# Patient Record
Sex: Male | Born: 2000 | Race: White | Hispanic: No | Marital: Single | Smoking: Never smoker
Health system: Southern US, Community
[De-identification: ages and names within clinical notes are randomized; demographics above are authoritative.]

## PROBLEM LIST (undated history)

## (undated) DIAGNOSIS — I350 Nonrheumatic aortic (valve) stenosis: Secondary | ICD-10-CM

## (undated) DIAGNOSIS — J45909 Unspecified asthma, uncomplicated: Secondary | ICD-10-CM

## (undated) HISTORY — PX: TONSILLECTOMY: SUR1361

---
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
## 2018-01-13 ENCOUNTER — Other Ambulatory Visit: Payer: Self-pay

## 2018-01-13 ENCOUNTER — Ambulatory Visit
Admission: EM | Admit: 2018-01-13 | Discharge: 2018-01-13 | Disposition: A | Discharge status: Home / Self Care | Payer: Medicaid Other | Attending: Emergency Medicine | Admitting: Emergency Medicine

## 2018-01-13 DIAGNOSIS — S29012A Strain of muscle and tendon of back wall of thorax, initial encounter: Secondary | ICD-10-CM | POA: Diagnosis not present

## 2018-01-13 DIAGNOSIS — X58XXXA Exposure to other specified factors, initial encounter: Secondary | ICD-10-CM

## 2018-01-13 MED ORDER — METHOCARBAMOL 750 MG PO TABS: 1500.0000 mg | ORAL_TABLET | Freq: Three times a day (TID) | ORAL | 0 refills | Status: AC

## 2018-01-13 MED ORDER — IBUPROFEN 600 MG PO TABS: 600.0000 mg | ORAL_TABLET | Freq: Four times a day (QID) | ORAL | 0 refills | Status: DC | PRN

## 2018-01-13 NOTE — ED Provider Notes (Signed)
HPI  SUBJECTIVE:  Shawn Dominguez is a 17 y.o. male who presents with 2 days of sharp, constant, mid thoracic back pain.  States that he may have slept on his back wrong, as he woke up with this pain.  He does report doing heavy lifting at work, but denies any more lifting than usual.  He states it radiated into his right ribs and right anterior chest this morning, it lasted seconds but has resolved.  There is been no further episodes of radiation.  He reports a mild, nonproductive cough, but no wheezing, fevers, shortness of breath, DOE.  No rash.  He has never had symptoms like this before.  He has tried Advil 600 mg, Biofreeze with improvement in symptoms.  He also tried a heating pad.  Symptoms are worse with torso rotation, bending forward, sitting up straight.  He took ibuprofen 2 hours prior to evaluation.  He has a past medical history of aortic stenosis, asthma.  No history of emphysema, COPD, smoking, vaping, pneumothorax, cancer, osteoporosis, shingles, chickenpox.  [id number]:[id number], Renae Fickle, MD    Past Medical History:  Diagnosis Date  . Aortic valvar stenosis   . Asthma     Past Surgical History:  Procedure Laterality Date  . TONSILLECTOMY      History reviewed. No pertinent family history.  Social History   Tobacco Use  . Smoking status: Never Smoker  . Smokeless tobacco: Never Used  Substance Use Topics  . Alcohol use: No    Alcohol/week: 0.0 standard drinks  . Drug use: Not Currently    No current facility-administered medications for this encounter.   Current Outpatient Medications:  .  ibuprofen (ADVIL,MOTRIN) 600 MG tablet, Take 1 tablet (600 mg total) by mouth every 6 (six) hours as needed., Disp: 30 tablet, Rfl: 0 .  methocarbamol (ROBAXIN) 750 MG tablet, Take 2 tablets (1,500 mg total) by mouth 3 (three) times daily for 5 days., Disp: 30 tablet, Rfl: 0  No Known Allergies   ROS  As noted in HPI.   Physical Exam  BP 127/62 (BP Location: Right Arm)   Pulse 69    Temp 98.3 F (36.8 C) (Oral)   Resp 18   Ht 5\' 8"  (1.727 m)   Wt 72.6 kg   SpO2 100%   BMI 24.33 kg/m   Constitutional: Well developed, well nourished, no acute distress Eyes:  EOMI, conjunctiva normal bilaterally HENT: Normocephalic, atraumatic,mucus membranes moist Respiratory: Normal inspiratory effort lungs clear bilaterally, good air movement bilaterally. Cardiovascular: Normal rate regular rhythm.  Positive murmur GI: nondistended.  skin: No rash over back or torso, skin intact Musculoskeletal: Positive muscular tenderness along the left parathoracic area.  No bony tenderness over the C, T, L-spine.  No paralumbar tenderness.  No trapezial, rhomboid tenderness. Neurologic: Alert & oriented x 3, no focal neuro deficits Psychiatric: Speech and behavior appropriate   ED Course   Medications - No data to display  No orders of the defined types were placed in this encounter.   No results found for this or any previous visit (from the past 24 hour(s)). No results found.  ED Clinical Impression  Strain of thoracic back region   ED Assessment/Plan  Presentation consistent with thoracic back strain.  Doubt pneumothorax, cardiac etiology, shingles at this point in time.  Imaging deferred today as he has no history of cancer, osteoporosis or trauma, or bony tenderness.  Will send home with ibuprofen 600 mg to take with 1 g of Tylenol together 3 or  4 times a day as needed for pain, Robaxin 1500 mg p.o. 3 times daily, and a work note for 2 days.   Discussed medical decision-making, and plan for follow-up with the patient.  Follow Up with PMD or here as needed if not getting better.  Discussed signs and symptoms that should prompt return to the emergency department.  Patient agrees with plan.   Meds ordered this encounter  Medications  . ibuprofen (ADVIL,MOTRIN) 600 MG tablet    Sig: Take 1 tablet (600 mg total) by mouth every 6 (six) hours as needed.    Dispense:  30 tablet     Refill:  0  . methocarbamol (ROBAXIN) 750 MG tablet    Sig: Take 2 tablets (1,500 mg total) by mouth 3 (three) times daily for 5 days.    Dispense:  30 tablet    Refill:  0    *This clinic note was created using Scientist, clinical (histocompatibility and immunogenetics). Therefore, there may be occasional mistakes despite careful proofreading.  ?     Domenick Gong, MD 01/13/18 1000

## 2018-01-13 NOTE — ED Triage Notes (Signed)
Patient complains of mid back pain x 2 days. Patient states that he has been using tylenol, advil and biofreeze without relief. States that he thinks he might have slept wrong.

## 2018-01-13 NOTE — Discharge Instructions (Addendum)
ibuprofen 600 mg to take with 1 g of Tylenol together 3 or 4 times a day as needed for pain, Robaxin 1500 mg p by mouth 3 times daily.  Try some gentle stretching and deep tissue massage.

## 2019-04-18 ENCOUNTER — Encounter: Payer: Self-pay | Admitting: Emergency Medicine

## 2019-04-18 ENCOUNTER — Other Ambulatory Visit: Payer: Self-pay

## 2019-04-18 ENCOUNTER — Ambulatory Visit
Admission: EM | Admit: 2019-04-18 | Discharge: 2019-04-18 | Disposition: A | Discharge status: Home / Self Care | Payer: BC Managed Care – PPO | Attending: Family Medicine | Admitting: Family Medicine

## 2019-04-18 DIAGNOSIS — M791 Myalgia, unspecified site: Secondary | ICD-10-CM

## 2019-04-18 DIAGNOSIS — Z20822 Contact with and (suspected) exposure to covid-19: Secondary | ICD-10-CM | POA: Insufficient documentation

## 2019-04-18 DIAGNOSIS — R05 Cough: Secondary | ICD-10-CM | POA: Diagnosis not present

## 2019-04-18 DIAGNOSIS — R0981 Nasal congestion: Secondary | ICD-10-CM | POA: Diagnosis not present

## 2019-04-18 DIAGNOSIS — R059 Cough, unspecified: Secondary | ICD-10-CM

## 2019-04-18 DIAGNOSIS — Z7189 Other specified counseling: Secondary | ICD-10-CM

## 2019-04-18 NOTE — ED Provider Notes (Addendum)
[facility] ____________________________________________  Time seen: Approximately 7:32 PM  I have reviewed the triage vital signs and the nursing notes.   HISTORY  Chief Complaint COVID testing  HPI Shawn Dominguez is a 19 y.o. male presenting for COVID-19 testing.  Patient reports 4 days ago he had cough, congestion and some body aches that lasted for 2 days.  Patient reports that he is now feeling back to normal and feels well.  Denies known fevers.  Denies sore throat.  Denies current cough or congestion. Denies chest pain, shortness of breath, vomiting, diarrhea, changes in taste or smell.  States feels well.  Denies known sick contacts.  Denies aggravating or alleviating factors.  Reports otherwise doing well.   Past Medical History:  Diagnosis Date  . Aortic valvar stenosis   . Asthma     There are no problems to display for this patient.   Past Surgical History:  Procedure Laterality Date  . TONSILLECTOMY       No current facility-administered medications for this encounter.  Current Outpatient Medications:  .  ibuprofen (ADVIL,MOTRIN) 600 MG tablet, Take 1 tablet (600 mg total) by mouth every 6 (six) hours as needed., Disp: 30 tablet, Rfl: 0  Allergies Patient has no known allergies.  History reviewed. No pertinent family history.  Social History Social History   Tobacco Use  . Smoking status: Never Smoker  . Smokeless tobacco: Never Used  Substance Use Topics  . Alcohol use: No    Alcohol/week: 0.0 standard drinks  . Drug use: Not Currently    Review of Systems Constitutional: No fever ENT: No sore throat. Cardiovascular: Denies chest pain. Respiratory: Denies shortness of breath. Gastrointestinal: No abdominal pain.  No nausea, no vomiting.  No diarrhea.  Musculoskeletal: Negative for back pain. Skin: Negative for rash.  ____________________________________________   PHYSICAL EXAM:  VITAL SIGNS: ED Triage Vitals  Enc Vitals  Group     BP 04/18/19 1915 118/67     Pulse Rate 04/18/19 1915 80     Resp 04/18/19 1915 18     Temp 04/18/19 1915 98.7 F (37.1 C)     Temp Source 04/18/19 1915 Oral     SpO2 04/18/19 1915 100 %     Weight 04/18/19 1913 200 lb (90.7 kg)     Height 04/18/19 1913 5\' 8"  (1.727 m)     Head Circumference --      Peak Flow --      Pain Score 04/18/19 1913 0     Pain Loc --      Pain Edu? --      Excl. in [place]? --     Constitutional: Alert and oriented. Well appearing and in no acute distress. Eyes: Conjunctivae are normal.  ENT      Head: Normocephalic and atraumatic. Cardiovascular: Normal heart rate. Good peripheral circulation. Respiratory: Normal respiratory effort without tachypnea nor retractions. Breath sounds are clear and equal bilaterally. No wheezes, rales, rhonchi. Musculoskeletal:  Steady gait.  Neurologic:  Normal speech and language. Speech is normal. No gait instability.  Skin:  Skin is warm, dry and intact. No rash noted. Psychiatric: Mood and affect are normal. Speech and behavior are normal. Patient exhibits appropriate insight and judgment   ___________________________________________   LABS (all labs ordered are listed, but only abnormal results are displayed)  Labs Reviewed  NOVEL CORONAVIRUS, NAA (HOSP ORDER, SEND-OUT TO REF LAB; TAT 18-24 HRS)   ____________________________________________   PROCEDURES Procedures    INITIAL IMPRESSION /  ASSESSMENT AND PLAN / ED COURSE  Pertinent labs & imaging results that were available during my care of the patient were reviewed by me and considered in my medical decision making (see chart for details).  Well-appearing patient.  No acute distress.  Suspect recent viral illness.  COVID-19 testing completed and advice given.  Continue to monitor and supportive care.  Discussed follow up and return parameters including no resolution or any worsening concerns. Patient verbalized understanding and agreed to plan.    ____________________________________________   FINAL CLINICAL IMPRESSION(S) / ED DIAGNOSES  Final diagnoses:  Cough  Advice given about COVID-19 virus infection     ED Discharge Orders    None       Note: This dictation was prepared with Dragon dictation along with smaller phrase technology. Any transcriptional errors that result from this process are unintentional.           Renford Dills, NP 04/18/19 1940

## 2019-04-18 NOTE — ED Triage Notes (Signed)
Patient here for COVID testing. Denies exposure. States he had symptoms 4 days agp of a fever and cough but those have resolved.

## 2019-04-20 LAB — NOVEL CORONAVIRUS, NAA (HOSP ORDER, SEND-OUT TO REF LAB; TAT 18-24 HRS): SARS-CoV-2, NAA: NOT DETECTED

## 2019-08-22 ENCOUNTER — Ambulatory Visit: Payer: BC Managed Care – PPO | Attending: Internal Medicine

## 2019-08-22 DIAGNOSIS — Z23 Encounter for immunization: Secondary | ICD-10-CM

## 2019-08-22 NOTE — Progress Notes (Signed)
   Covid-19 Vaccination Clinic  Name:  Shawn Dominguez    MRN: [id number] DOB: June 10, 2000  08/22/2019  [name] was observed post Covid-19 immunization for 15 minutes without incident. He was provided with Vaccine Information Sheet and instruction to access the V-Safe system.   [name] was instructed to call 911 with any severe reactions post vaccine: Marland Kitchen Difficulty breathing  . Swelling of face and throat  . A fast heartbeat  . A bad rash all over body  . Dizziness and weakness   Immunizations Administered    Name Date Dose VIS Date Route   Pfizer COVID-19 Vaccine 08/22/2019  6:00 PM 0.3 mL 05/23/2018 Intramuscular   Manufacturer: [employer], [employer]   Lot: [id number]   NDC: [address]-8

## 2019-09-12 ENCOUNTER — Ambulatory Visit: Payer: BC Managed Care – PPO | Attending: Internal Medicine

## 2019-09-12 DIAGNOSIS — Z23 Encounter for immunization: Secondary | ICD-10-CM

## 2019-09-12 NOTE — Progress Notes (Signed)
° °  Covid-19 Vaccination Clinic  Name:  Shawn Dominguez    MRN: [id number] DOB: 2000-10-07  09/12/2019  [name] was observed post Covid-19 immunization for 15 minutes without incident. He was provided with Vaccine Information Sheet and instruction to access the V-Safe system.   [name] was instructed to call 911 with any severe reactions post vaccine:  Difficulty breathing   Swelling of face and throat   A fast heartbeat   A bad rash all over body   Dizziness and weakness   Immunizations Administered    Name Date Dose VIS Date Route   Pfizer COVID-19 Vaccine 09/12/2019  4:15 PM 0.3 mL 05/23/2018 Intramuscular   Manufacturer: [employer], [employer]   Lot: [id number]   NDC: [address]-3

## 2020-04-02 ENCOUNTER — Encounter: Payer: Self-pay | Admitting: Emergency Medicine

## 2020-04-02 ENCOUNTER — Ambulatory Visit
Admission: EM | Admit: 2020-04-02 | Discharge: 2020-04-02 | Disposition: A | Discharge status: Home / Self Care | Payer: BC Managed Care – PPO | Attending: Emergency Medicine | Admitting: Emergency Medicine

## 2020-04-02 ENCOUNTER — Other Ambulatory Visit: Payer: Self-pay

## 2020-04-02 DIAGNOSIS — L02412 Cutaneous abscess of left axilla: Secondary | ICD-10-CM

## 2020-04-02 MED ORDER — SULFAMETHOXAZOLE-TRIMETHOPRIM 800-160 MG PO TABS: 1.0000 | ORAL_TABLET | Freq: Two times a day (BID) | ORAL | 0 refills | Status: AC

## 2020-04-02 MED ORDER — HYDROCODONE-ACETAMINOPHEN 5-325 MG PO TABS: 2.0000 | ORAL_TABLET | ORAL | 0 refills | Status: DC | PRN

## 2020-04-02 NOTE — ED Provider Notes (Signed)
[facility]    CSN: [id number] Arrival date & time: 04/02/20  1211      History   Chief Complaint Chief Complaint  Patient presents with  . Abscess    Left axilla    HPI Shawn Dominguez is a 20 y.o. male.   HPI   20 year old male here for evaluation of an abscess in his left armpit.  Patient reports that he noticed it 2 to 3 days ago.  He states that it is draining and he can get some yellow pus out when he squeezes it.  The area is very tender, red, and swollen.  Patient has had an abscess once before on his tailbone.  Patient denies any history of MRSA.  Patient denies fever.  Past Medical History:  Diagnosis Date  . Aortic valvar stenosis   . Asthma     There are no problems to display for this patient.   Past Surgical History:  Procedure Laterality Date  . TONSILLECTOMY         Home Medications    Prior to Admission medications   Medication Sig Start Date End Date Taking? Authorizing Provider  HYDROcodone-acetaminophen (NORCO/VICODIN) 5-325 MG tablet Take 2 tablets by mouth every 4 (four) hours as needed. 04/02/20  Yes Becky Augusta, NP  sulfamethoxazole-trimethoprim (BACTRIM DS) 800-160 MG tablet Take 1 tablet by mouth 2 (two) times daily for 10 days. 04/02/20 04/12/20 Yes Becky Augusta, NP  ibuprofen (ADVIL,MOTRIN) 600 MG tablet Take 1 tablet (600 mg total) by mouth every 6 (six) hours as needed. 01/13/18   Domenick Gong, MD    Family History Family History  Problem Relation Age of Onset  . Healthy Mother   . Healthy Father     Social History Social History   Tobacco Use  . Smoking status: Never Smoker  . Smokeless tobacco: Never Used  Vaping Use  . Vaping Use: Never used  Substance Use Topics  . Alcohol use: No    Alcohol/week: 0.0 standard drinks  . Drug use: Not Currently     Allergies   Patient has no known allergies.   Review of Systems Review of Systems  Constitutional: Negative for activity change, appetite change and  fever.  Skin: Positive for color change.  Hematological: Negative.   Psychiatric/Behavioral: Negative.      Physical Exam Triage Vital Signs ED Triage Vitals  Enc Vitals Group     BP 04/02/20 1416 123/67     Pulse Rate 04/02/20 1416 60     Resp 04/02/20 1416 18     Temp 04/02/20 1416 98.2 F (36.8 C)     Temp Source 04/02/20 1416 Oral     SpO2 04/02/20 1416 99 %     Weight 04/02/20 1327 200 lb (90.7 kg)     Height 04/02/20 1327 5\' 8"  (1.727 m)     Head Circumference --      Peak Flow --      Pain Score 04/02/20 1327 8     Pain Loc --      Pain Edu? --      Excl. in [place]? --    No data found.  Updated Vital Signs BP 123/67 (BP Location: Left Arm)   Pulse 60   Temp 98.2 F (36.8 C) (Oral)   Resp 18   Ht 5\' 8"  (1.727 m)   Wt 200 lb (90.7 kg)   SpO2 99%   BMI 30.41 kg/m   Visual Acuity Right Eye Distance:   Left  Eye Distance:   Bilateral Distance:    Right Eye Near:   Left Eye Near:    Bilateral Near:     Physical Exam Vitals and nursing note reviewed.  Constitutional:      General: He is not in acute distress.    Appearance: Normal appearance. He is not toxic-appearing.  HENT:     Head: Normocephalic and atraumatic.  Musculoskeletal:        General: Swelling and tenderness present.  Skin:    General: Skin is warm and dry.     Capillary Refill: Capillary refill takes less than 2 seconds.     Findings: Erythema present.  Neurological:     General: No focal deficit present.     Mental Status: He is alert and oriented to person, place, and time.  Psychiatric:        Mood and Affect: Mood normal.        Behavior: Behavior normal.        Thought Content: Thought content normal.        Judgment: Judgment normal.      UC Treatments / Results  Labs (all labs ordered are listed, but only abnormal results are displayed) Labs Reviewed - No data to display  EKG   Radiology No results found.  Procedures Procedures (including critical care  time)  Medications Ordered in UC Medications - No data to display  Initial Impression / Assessment and Plan / UC Course  I have reviewed the triage vital signs and the nursing notes.  Pertinent labs & imaging results that were available during my care of the patient were reviewed by me and considered in my medical decision making (see chart for details).   Duration of an abscess in his left axilla that is been there for the past 2 days.  The area where the axilla and the chest wall meet there is an area of redness and induration with mild swelling.  While palpating the area a small amount of yellow pus was expressed from a tract.  There is no loculation or fluctuance indicating a fluid collection.  Patient Isabella Stalling that he squeezed it yesterday and got some fluid out.  Patient presentation is consistent with cellulitis with abscess formation.  Given that it is draining on his own will encourage patient to continually apply warm compresses to try to facilitate the remainder of the infection to drain.  Will put patient on Bactrim twice daily for 10 days and give patient Norco for pain.   Final Clinical Impressions(s) / UC Diagnoses   Final diagnoses:  Abscess of axilla, left     Discharge Instructions     Apply warm compresses continually to the area in your left armpit where the abscess is to help fenestrating the infection.  Take the Bactrim twice daily for 10 days.  Take with a full glass of water.  Use over-the-counter ibuprofen as needed for mild to moderate pain.  Use the Norco, 1 tablet every 6 hours as needed for severe pain.  If your symptoms do not improve return for reevaluation.    ED Prescriptions    Medication Sig Dispense Auth. Provider   sulfamethoxazole-trimethoprim (BACTRIM DS) 800-160 MG tablet Take 1 tablet by mouth 2 (two) times daily for 10 days. 20 tablet Becky Augusta, NP   HYDROcodone-acetaminophen (NORCO/VICODIN) 5-325 MG tablet Take 2 tablets by mouth every 4  (four) hours as needed. 6 tablet Becky Augusta, NP     I have reviewed the PDMP during  this encounter.   Margarette Canada, NP 04/02/20 1510

## 2020-04-02 NOTE — Discharge Instructions (Addendum)
Apply warm compresses continually to the area in your left armpit where the abscess is to help fenestrating the infection.  Take the Bactrim twice daily for 10 days.  Take with a full glass of water.  Use over-the-counter ibuprofen as needed for mild to moderate pain.  Use the Norco, 1 tablet every 6 hours as needed for severe pain.  If your symptoms do not improve return for reevaluation.

## 2020-04-02 NOTE — ED Triage Notes (Signed)
Pt c/o abscess in left axilla. Started 2 days ago. He states it is draining.

## 2022-03-05 ENCOUNTER — Ambulatory Visit
Admission: EM | Admit: 2022-03-05 | Discharge: 2022-03-05 | Disposition: A | Discharge status: Home / Self Care | Payer: BC Managed Care – PPO | Attending: Physician Assistant | Admitting: Physician Assistant

## 2022-03-05 DIAGNOSIS — Z1152 Encounter for screening for COVID-19: Secondary | ICD-10-CM | POA: Insufficient documentation

## 2022-03-05 DIAGNOSIS — J45901 Unspecified asthma with (acute) exacerbation: Secondary | ICD-10-CM | POA: Insufficient documentation

## 2022-03-05 DIAGNOSIS — R0602 Shortness of breath: Secondary | ICD-10-CM

## 2022-03-05 DIAGNOSIS — Z7951 Long term (current) use of inhaled steroids: Secondary | ICD-10-CM | POA: Insufficient documentation

## 2022-03-05 DIAGNOSIS — R051 Acute cough: Secondary | ICD-10-CM | POA: Insufficient documentation

## 2022-03-05 LAB — RESP PANEL BY RT-PCR (FLU A&B, COVID) ARPGX2
Influenza A by PCR: NEGATIVE
Influenza B by PCR: NEGATIVE
SARS Coronavirus 2 by RT PCR: NEGATIVE

## 2022-03-05 MED ORDER — DEXAMETHASONE SODIUM PHOSPHATE 10 MG/ML IJ SOLN
10.0000 mg | Freq: Once | INTRAMUSCULAR | Status: AC
  Administered 2022-03-05: 10 mg via INTRAMUSCULAR

## 2022-03-05 MED ORDER — PSEUDOEPH-BROMPHEN-DM 30-2-10 MG/5ML PO SYRP: 10.0000 mL | ORAL_SOLUTION | Freq: Four times a day (QID) | ORAL | 0 refills | Status: AC | PRN

## 2022-03-05 MED ORDER — PREDNISONE 10 MG PO TABS: ORAL_TABLET | ORAL | 0 refills | Status: DC

## 2022-03-05 NOTE — Discharge Instructions (Signed)
-  We are testing you for COVID and flu.  We will call you with any positive results and send antiviral medication if necessary.  If you do not hear from [place], testing is negative and you have a virus and flareup of your asthma.  I sent steroids to the pharmacy and you received a steroid injection today.  Start the oral corticosteroids tomorrow early evening or afternoon.  Plenty rest and fluids. - You declined a breathing treatment here.  If you are inhaler and the corticosteroid did not improve your breathing in the next day or 2 you need to be seen again right away in the emergency department.

## 2022-03-05 NOTE — ED Triage Notes (Signed)
Pt c/o SOB, nasal congestion, cough, sore throat, loss of taste x2days  Pt states that he has an inhaler  Pt took Zithromax this morning and states this was his first dose.

## 2022-03-05 NOTE — ED Provider Notes (Signed)
[facility]    CSN: [id number] Arrival date & time: 03/05/22  1813      History   Chief Complaint Chief Complaint  Patient presents with   Shortness of Breath   Cough    HPI Shawn Dominguez is a 21 y.o. male with history of asthma.  He presents today for onset of cough, congestion, sore throat, diminished sense of taste about 2 days ago.  He denies any fever.  He has had shortness of breath and wheezing.  He is used his albuterol inhaler without any improvement in his shortness of breath.  He reports that he is taking azithromycin as prescribed by his PCP and took first dose this morning.  He is not taking any cough medication.  He initially does not want to be tested for COVID and flu but agrees to be tested.  He has no other complaints.  HPI  Past Medical History:  Diagnosis Date   Aortic valvar stenosis    Asthma     There are no problems to display for this patient.   Past Surgical History:  Procedure Laterality Date   TONSILLECTOMY         Home Medications    Prior to Admission medications   Medication Sig Start Date End Date Taking? Authorizing Provider  albuterol (VENTOLIN HFA) 108 (90 Base) MCG/ACT inhaler Inhale into the lungs. 02/19/22 02/19/23 Yes [provider]  azithromycin (ZITHROMAX) 250 MG tablet Take 2 tablets (500 mg) on  Day 1,  followed by 1 tablet (250 mg) once daily on Days 2 through 5. 03/03/22 03/08/22 Yes [provider]  brompheniramine-pseudoephedrine-DM 30-2-10 MG/5ML syrup Take 10 mLs by mouth 4 (four) times daily as needed for up to 7 days. 03/05/22 03/12/22 Yes Shirlee Latch, PA-C  predniSONE (DELTASONE) 10 MG tablet Take 6 tabs on day 1 and decrease by 1 tablet daily until complete 03/05/22  Yes Shirlee Latch, PA-C    Family History Family History  Problem Relation Age of Onset   Healthy Mother    Healthy Father     Social History Social History   Tobacco Use   Smoking status: Never   Smokeless  tobacco: Never  Vaping Use   Vaping Use: Never used  Substance Use Topics   Alcohol use: No    Alcohol/week: 0.0 standard drinks of alcohol   Drug use: Not Currently     Allergies   Patient has no known allergies.   Review of Systems Review of Systems  Constitutional:  Positive for fatigue. Negative for fever.  HENT:  Positive for congestion, rhinorrhea and sore throat. Negative for sinus pressure and sinus pain.   Respiratory:  Positive for cough and shortness of breath.   Cardiovascular:  Negative for chest pain.  Gastrointestinal:  Negative for abdominal pain, diarrhea, nausea and vomiting.  Musculoskeletal:  Negative for myalgias.  Neurological:  Positive for headaches. Negative for weakness and light-headedness.  Hematological:  Negative for adenopathy.     Physical Exam Triage Vital Signs ED Triage Vitals  Enc Vitals Group     BP 03/05/22 1837 120/79     Pulse Rate 03/05/22 1837 (!) 114     Resp 03/05/22 1837 18     Temp 03/05/22 1837 98.5 F (36.9 C)     Temp Source 03/05/22 1837 Oral     SpO2 03/05/22 1837 93 %     Weight 03/05/22 1836 240 lb (108.9 kg)     Height 03/05/22 1836 5'  7" (1.702 m)     Head Circumference --      Peak Flow --      Pain Score 03/05/22 1835 7     Pain Loc --      Pain Edu? --      Excl. in GC? --    No data found.  Updated Vital Signs BP 120/79 (BP Location: Left Arm)   Pulse (!) 114   Temp 98.5 F (36.9 C) (Oral)   Resp 18   Ht 5\' 7"  (1.702 m)   Wt 240 lb (108.9 kg)   SpO2 93%   BMI 37.59 kg/m    Physical Exam Vitals and nursing note reviewed.  Constitutional:      General: He is not in acute distress.    Appearance: Normal appearance. He is well-developed.  HENT:     Head: Normocephalic and atraumatic.     Nose: Congestion present.     Mouth/Throat:     Mouth: Mucous membranes are moist.     Pharynx: Oropharynx is clear.  Eyes:     General: No scleral icterus.    Conjunctiva/sclera: Conjunctivae normal.   Cardiovascular:     Rate and Rhythm: Regular rhythm. Tachycardia present.     Heart sounds: Normal heart sounds.  Pulmonary:     Effort: Pulmonary effort is normal. No respiratory distress.     Breath sounds: Wheezing present. No rhonchi or rales.  Abdominal:     Palpations: Abdomen is soft.  Musculoskeletal:     Cervical back: Neck supple.  Skin:    General: Skin is warm and dry.     Capillary Refill: Capillary refill takes less than 2 seconds.  Neurological:     General: No focal deficit present.     Mental Status: He is alert. Mental status is at baseline.     Motor: No weakness.     Gait: Gait normal.  Psychiatric:        Mood and Affect: Mood normal.        Behavior: Behavior normal.      UC Treatments / Results  Labs (all labs ordered are listed, but only abnormal results are displayed) Labs Reviewed  RESP PANEL BY RT-PCR (FLU A&B, COVID) ARPGX2    EKG   Radiology No results found.  Procedures Procedures (including critical care time)  Medications Ordered in UC Medications  dexamethasone (DECADRON) injection 10 mg (10 mg Intramuscular Given 03/05/22 1853)    Initial Impression / Assessment and Plan / UC Course  I have reviewed the triage vital signs and the nursing notes.  Pertinent labs & imaging results that were available during my care of the patient were reviewed by me and considered in my medical decision making (see chart for details).    21 year old male with history of asthma presents for cough, congestion, shortness of breath, wheezing, loss of taste over the past 1-1/2 to 2 days.  Is taking azithromycin and using albuterol.  Initially declines COVID and flu testing but is agreeable to it after I speak with him.  He is not in any acute distress and speaking in full sentences.  His oxygen is 93 to percent to 94%.  He is tachycardic.  He does have scattered wheezes throughout.  Nasal congestion also present.  Respiratory panel obtained.  Awaiting  result.  He declines nebulizer treatment.  Will treat at this time with Bromfed-DM and prednisone.  He was given dexamethasone injection of 10 mg in the clinic and advised  to start the prednisone taper tomorrow.  All negative respiratory panel.  Suspect patient does have viral illness flaring up his asthma.  We thoroughly discussed emergency department guidelines especially if his breathing worsens.  He is agreeable.   Final Clinical Impressions(s) / UC Diagnoses   Final diagnoses:  Asthma with acute exacerbation, unspecified asthma severity, unspecified whether persistent  Acute cough  Shortness of breath     Discharge Instructions      -We are testing you for COVID and flu.  We will call you with any positive results and send antiviral medication if necessary.  If you do not hear from [place], testing is negative and you have a virus and flareup of your asthma.  I sent steroids to the pharmacy and you received a steroid injection today.  Start the oral corticosteroids tomorrow early evening or afternoon.  Plenty rest and fluids. - You declined a breathing treatment here.  If you are inhaler and the corticosteroid did not improve your breathing in the next day or 2 you need to be seen again right away in the emergency department.     ED Prescriptions     Medication Sig Dispense Auth. Provider   brompheniramine-pseudoephedrine-DM 30-2-10 MG/5ML syrup Take 10 mLs by mouth 4 (four) times daily as needed for up to 7 days. 150 mL Eusebio Friendly B, PA-C   predniSONE (DELTASONE) 10 MG tablet Take 6 tabs on day 1 and decrease by 1 tablet daily until complete 21 tablet Shirlee Latch, PA-C      PDMP not reviewed this encounter.   Shirlee Latch, PA-C 03/05/22 1936

## 2022-03-15 ENCOUNTER — Encounter: Payer: Self-pay | Admitting: Emergency Medicine

## 2022-03-15 ENCOUNTER — Ambulatory Visit
Admission: EM | Admit: 2022-03-15 | Discharge: 2022-03-15 | Disposition: A | Discharge status: Home / Self Care | Payer: Self-pay | Attending: Family Medicine | Admitting: Family Medicine

## 2022-03-15 DIAGNOSIS — H6121 Impacted cerumen, right ear: Secondary | ICD-10-CM

## 2022-03-15 NOTE — ED Triage Notes (Signed)
Pt c/o bilateral ear fullness. He states the right is worse than the left and is making it hard to ear. Started about a week ago.

## 2022-03-15 NOTE — Discharge Instructions (Signed)
Stop by the pharmacy to pick up Debrox for earwax removal. Use once a week for the next 2-3 weeks. Then monthly, to prevent blockage.

## 2022-03-15 NOTE — ED Provider Notes (Signed)
[facility]    CSN: [id number] Arrival date & time: 03/15/22  1134      History   Chief Complaint Chief Complaint  Patient presents with   Ear Fullness    bilateral    HPI Shawn Dominguez is a 21 y.o. male.   HPI   Shawn Dominguez presents for muffled hearing in the right ear.  He has been using the Debrox system without relief.  He had some pressure in his left ear however this has resolved.  Has no respiratory symptoms at this time.  Denies pain, tenderness, discharge.  He believes it is related to his earwax.  He does use ear buds and notices that when he takes them out he has earwax on them.  He is requesting a leave note as he was seen on 03/05/2022 and missed a week of work.      Past Medical History:  Diagnosis Date   Aortic valvar stenosis    Asthma     There are no problems to display for this patient.   Past Surgical History:  Procedure Laterality Date   TONSILLECTOMY         Home Medications    Prior to Admission medications   Medication Sig Start Date End Date Taking? Authorizing Provider  albuterol (VENTOLIN HFA) 108 (90 Base) MCG/ACT inhaler Inhale into the lungs. 02/19/22 02/19/23  [provider]    Family History Family History  Problem Relation Age of Onset   Healthy Mother    Healthy Father     Social History Social History   Tobacco Use   Smoking status: Never   Smokeless tobacco: Never  Vaping Use   Vaping Use: Never used  Substance Use Topics   Alcohol use: No    Alcohol/week: 0.0 standard drinks of alcohol   Drug use: Not Currently     Allergies   Patient has no known allergies.   Review of Systems Review of Systems: :negative unless otherwise stated in HPI.      Physical Exam Triage Vital Signs ED Triage Vitals  Enc Vitals Group     BP 03/15/22 1352 (!) 129/99     Pulse Rate 03/15/22 1352 (!) 108     Resp 03/15/22 1352 16     Temp 03/15/22 1352 98.9 F (37.2 C)     Temp Source 03/15/22  1352 Oral     SpO2 03/15/22 1352 100 %     Weight 03/15/22 1349 240 lb 1.3 oz (108.9 kg)     Height 03/15/22 1349 5\' 7"  (1.702 m)     Head Circumference --      Peak Flow --      Pain Score 03/15/22 1349 3     Pain Loc --      Pain Edu? --      Excl. in GC? --    No data found.  Updated Vital Signs BP (!) 129/99 (BP Location: Left Arm)   Pulse (!) 108   Temp 98.9 F (37.2 C) (Oral)   Resp 16   Ht 5\' 7"  (1.702 m)   Wt 108.9 kg   SpO2 100%   BMI 37.60 kg/m   Visual Acuity Right Eye Distance:   Left Eye Distance:   Bilateral Distance:    Right Eye Near:   Left Eye Near:    Bilateral Near:     Physical Exam GEN:     alert, non-toxic appearing male in no distress    HENT:  mucus  membranes moist, no nasal discharge, right TM cerumen impacted, left TM normal, normal external auditory canals bilaterally, nontender tragus EYES:   no scleral injection or discharge NECK:  normal ROM RESP:  no increased work of breathing,  CVS:   regular rate Skin:   warm and dry    UC Treatments / Results  Labs (all labs ordered are listed, but only abnormal results are displayed) Labs Reviewed - No data to display  EKG   Radiology No results found.  Procedures Procedures (including critical care time)  Medications Ordered in UC Medications - No data to display  Initial Impression / Assessment and Plan / UC Course  I have reviewed the triage vital signs and the nursing notes.  Pertinent labs & imaging results that were available during my care of the patient were reviewed by me and considered in my medical decision making (see chart for details).     Cerumen Impaction  Ceruminosis is noted on the right.  Wax is removed by syringing. Instructions for home care to prevent wax buildup are given.    Asthma Stable. Patient seen on 03/05/2022 for asthma exacerbation.  He states he missed a full week of work and is requesting relief note.  Explained to patient that he is unable  to get a backdated note.  Negative for COVID and flu at the time.   Discussed MDM, treatment plan and plan for follow-up with patient/parent who agrees with plan.   Final Clinical Impressions(s) / UC Diagnoses   Final diagnoses:  Hearing loss of right ear due to cerumen impaction     Discharge Instructions      Stop by the pharmacy to pick up Debrox for earwax removal. Use once a week for the next 2-3 weeks. Then monthly, to prevent blockage.         ED Prescriptions   None    PDMP not reviewed this encounter.   Katha Cabal, DO 03/15/22 1508

## 2022-03-25 ENCOUNTER — Ambulatory Visit: Admit: 2022-03-25 | Payer: Self-pay

## 2022-03-26 ENCOUNTER — Encounter: Payer: Self-pay | Admitting: Emergency Medicine

## 2022-03-26 ENCOUNTER — Ambulatory Visit
Admission: EM | Admit: 2022-03-26 | Discharge: 2022-03-26 | Disposition: A | Discharge status: Home / Self Care | Payer: Self-pay | Attending: Emergency Medicine | Admitting: Emergency Medicine

## 2022-03-26 DIAGNOSIS — H66003 Acute suppurative otitis media without spontaneous rupture of ear drum, bilateral: Secondary | ICD-10-CM

## 2022-03-26 MED ORDER — AMOXICILLIN-POT CLAVULANATE 875-125 MG PO TABS: 1.0000 | ORAL_TABLET | Freq: Two times a day (BID) | ORAL | 0 refills | Status: AC

## 2022-03-26 NOTE — ED Provider Notes (Signed)
[facility]    CSN: [id number] Arrival date & time: 03/26/22  1728      History   Chief Complaint Chief Complaint  Patient presents with   Ear Fullness    HPI Shawn Dominguez is a 21 y.o. male.   HPI  21 year old male here for evaluation of bilateral ear fullness.  Patient reports that he has had ear fullness for the last couple of days.  This is not associate with any fever, runny nose, nasal congestion, or drainage from his ears.  He does endorse some mild ringing on the left.  He is also had a mild sore throat and a mild nonproductive cough.  Past Medical History:  Diagnosis Date   Aortic valvar stenosis    Asthma     There are no problems to display for this patient.   Past Surgical History:  Procedure Laterality Date   TONSILLECTOMY         Home Medications    Prior to Admission medications   Medication Sig Start Date End Date Taking? Authorizing Provider  amoxicillin-clavulanate (AUGMENTIN) 875-125 MG tablet Take 1 tablet by mouth every 12 (twelve) hours for 10 days. 03/26/22 04/05/22 Yes Becky Augusta, NP  albuterol (VENTOLIN HFA) 108 (90 Base) MCG/ACT inhaler Inhale into the lungs. 02/19/22 02/19/23  [provider]    Family History Family History  Problem Relation Age of Onset   Healthy Mother    Healthy Father     Social History Social History   Tobacco Use   Smoking status: Never   Smokeless tobacco: Never  Vaping Use   Vaping Use: Never used  Substance Use Topics   Alcohol use: No    Alcohol/week: 0.0 standard drinks of alcohol   Drug use: Not Currently     Allergies   Patient has no known allergies.   Review of Systems Review of Systems  Constitutional:  Negative for fever.  HENT:  Positive for ear pain and sore throat. Negative for ear discharge.   Respiratory:  Positive for cough.      Physical Exam Triage Vital Signs ED Triage Vitals  Enc Vitals Group     BP 03/26/22 1809 111/72     Pulse Rate  03/26/22 1809 82     Resp 03/26/22 1809 15     Temp 03/26/22 1809 97.8 F (36.6 C)     Temp Source 03/26/22 1809 Oral     SpO2 03/26/22 1809 99 %     Weight 03/26/22 1807 240 lb (108.9 kg)     Height 03/26/22 1807 5\' 7"  (1.702 m)     Head Circumference --      Peak Flow --      Pain Score 03/26/22 1807 0     Pain Loc --      Pain Edu? --      Excl. in GC? --    No data found.  Updated Vital Signs BP 111/72 (BP Location: Right Arm)   Pulse 82   Temp 97.8 F (36.6 C) (Oral)   Resp 15   Ht 5\' 7"  (1.702 m)   Wt 240 lb (108.9 kg)   SpO2 99%   BMI 37.59 kg/m   Visual Acuity Right Eye Distance:   Left Eye Distance:   Bilateral Distance:    Right Eye Near:   Left Eye Near:    Bilateral Near:     Physical Exam Vitals and nursing note reviewed.  Constitutional:      Appearance:  Normal appearance. He is not ill-appearing.  HENT:     Head: Normocephalic and atraumatic.     Right Ear: Ear canal and external ear normal. There is no impacted cerumen.     Left Ear: Ear canal and external ear normal. There is no impacted cerumen.     Ears:     Comments: Bilateral tympanic membranes erythematous injected with a loss of landmarks.    Mouth/Throat:     Mouth: Mucous membranes are moist.     Pharynx: Oropharynx is clear. No oropharyngeal exudate or posterior oropharyngeal erythema.  Skin:    General: Skin is warm and dry.     Capillary Refill: Capillary refill takes less than 2 seconds.  Neurological:     General: No focal deficit present.     Mental Status: He is alert and oriented to person, place, and time.  Psychiatric:        Mood and Affect: Mood normal.        Behavior: Behavior normal.        Thought Content: Thought content normal.        Judgment: Judgment normal.      UC Treatments / Results  Labs (all labs ordered are listed, but only abnormal results are displayed) Labs Reviewed - No data to display  EKG   Radiology No results  found.  Procedures Procedures (including critical care time)  Medications Ordered in UC Medications - No data to display  Initial Impression / Assessment and Plan / UC Course  I have reviewed the triage vital signs and the nursing notes.  Pertinent labs & imaging results that were available during my care of the patient were reviewed by me and considered in my medical decision making (see chart for details).   Patient is a nontoxic-appearing 21 year old male here for evaluation of bilateral ear fullness that is been going on for the past 2 to 3 days as outlined HPI above.  On exam patient does have erythematous and injected tympanic membranes bilaterally.  This is consistent with bilateral otitis media.  I will discharge patient home on Augmentin twice daily for 10 days, 875 mg per dose, for treatment of bilateral otitis media.  Tylenol and ibuprofen as needed for pain and fever.  Return precautions reviewed.   Final Clinical Impressions(s) / UC Diagnoses   Final diagnoses:  Non-recurrent acute suppurative otitis media of both ears without spontaneous rupture of tympanic membranes     Discharge Instructions      Take the Augmentin twice daily for 10 days with food for treatment of your ear infection.  Take an over-the-counter probiotic 1 hour after each dose of antibiotic to prevent diarrhea.  Use over-the-counter Tylenol and ibuprofen as needed for pain or fever.  Place a hot water bottle, or heating pad, underneath your pillowcase at night to help dilate up your ear and aid in pain relief as well as resolution of the infection.  Return for reevaluation for any new or worsening symptoms.      ED Prescriptions     Medication Sig Dispense Auth. Provider   amoxicillin-clavulanate (AUGMENTIN) 875-125 MG tablet Take 1 tablet by mouth every 12 (twelve) hours for 10 days. 20 tablet Becky Augusta, NP      PDMP not reviewed this encounter.   Becky Augusta, NP 03/26/22 1942

## 2022-03-26 NOTE — Discharge Instructions (Signed)
Take the Augmentin twice daily for 10 days with food for treatment of your ear infection.  Take an over-the-counter probiotic 1 hour after each dose of antibiotic to prevent diarrhea.  Use over-the-counter Tylenol and ibuprofen as needed for pain or fever.  Place a hot water bottle, or heating pad, underneath your pillowcase at night to help dilate up your ear and aid in pain relief as well as resolution of the infection.  Return for reevaluation for any new or worsening symptoms.  

## 2022-03-26 NOTE — ED Triage Notes (Signed)
Patient reports fullness in both ears for the past couple of days.  Patient denies pain ro fevers.
